# Patient Record
Sex: Male | Born: 1950
Health system: Southern US, Community
[De-identification: ages and names within clinical notes are randomized; demographics above are authoritative.]

---

## 1986-04-08 HISTORY — PX: WISDOM TOOTH EXTRACTION: SHX21

## 2013-08-13 ENCOUNTER — Encounter: Payer: Self-pay | Admitting: Internal Medicine

## 2013-10-04 ENCOUNTER — Ambulatory Visit (AMBULATORY_SURGERY_CENTER): Payer: Medicare Other | Admitting: *Deleted

## 2013-10-04 VITALS — Ht 67.0 in | Wt 216.0 lb

## 2013-10-04 DIAGNOSIS — Z1211 Encounter for screening for malignant neoplasm of colon: Secondary | ICD-10-CM

## 2013-10-04 MED ORDER — MOVIPREP 100 G PO SOLR: ORAL | Status: DC

## 2013-10-04 NOTE — Progress Notes (Signed)
No allergies to eggs or soy. No problems with anesthesia.  Pt given Emmi instructions for colonoscopy  No oxygen use  No diet drug use  

## 2013-10-07 ENCOUNTER — Encounter: Payer: Self-pay | Admitting: Internal Medicine

## 2013-10-18 ENCOUNTER — Encounter: Payer: Self-pay | Admitting: Internal Medicine

## 2013-10-29 ENCOUNTER — Ambulatory Visit (AMBULATORY_SURGERY_CENTER): Payer: Medicare Other | Admitting: Internal Medicine

## 2013-10-29 ENCOUNTER — Encounter: Payer: Self-pay | Admitting: Internal Medicine

## 2013-10-29 VITALS — BP 131/88 | HR 63 | Temp 97.4°F | Resp 13 | Ht 67.0 in | Wt 216.0 lb

## 2013-10-29 DIAGNOSIS — D128 Benign neoplasm of rectum: Secondary | ICD-10-CM

## 2013-10-29 DIAGNOSIS — Z1211 Encounter for screening for malignant neoplasm of colon: Secondary | ICD-10-CM

## 2013-10-29 DIAGNOSIS — D129 Benign neoplasm of anus and anal canal: Secondary | ICD-10-CM

## 2013-10-29 DIAGNOSIS — D126 Benign neoplasm of colon, unspecified: Secondary | ICD-10-CM

## 2013-10-29 MED ORDER — SODIUM CHLORIDE 0.9 % IV SOLN: 500.0000 mL | INTRAVENOUS | Status: DC

## 2013-10-29 NOTE — Progress Notes (Signed)
Procedure ends, to recovery, report given and VSS. 

## 2013-10-29 NOTE — Progress Notes (Signed)
Called to room to assist during endoscopic procedure.  Patient ID and intended procedure confirmed with present staff. Received instructions for my participation in the procedure from the performing physician.  

## 2013-10-29 NOTE — Op Note (Signed)
[facility]  [address]. [place], [address]   COLONOSCOPY PROCEDURE REPORT  PATIENT: Edwin Miller, Edwin Miller  MR#: [id number] BIRTHDATE: [birth year] , 8  yrs. old GENDER: Male ENDOSCOPIST: Eustace Quail, MD REFERRED HE:RDEYC Ardeth Perfect, M.D. PROCEDURE DATE:  10/29/2013 PROCEDURE:   Colonoscopy with snare polypectomy x 1 First Screening Colonoscopy - Avg.  risk and is 50 yrs.  old or older Yes.  Prior Negative Screening - Now for repeat screening. N/A  History of Adenoma - Now for follow-up colonoscopy & has been > or = to 3 yrs.  N/A  Polyps Removed Today? Yes. ASA CLASS:   Class I INDICATIONS:average risk screening. MEDICATIONS: MAC sedation, administered by CRNA and propofol (Diprivan) 350mg  IV  DESCRIPTION OF PROCEDURE:   After the risks benefits and alternatives of the procedure were thoroughly explained, informed consent was obtained.  A digital rectal exam revealed no abnormalities of the rectum.   The LB [id number]  endoscope was introduced through the anus and advanced to the cecum, which was identified by both the appendix and ileocecal valve. No adverse events experienced.   The quality of the prep was excellent, using MoviPrep  The instrument was then slowly withdrawn as the colon was fully examined.   COLON FINDINGS: A diminutive polyp was found in the rectum.  A polypectomy was performed with a cold snare.  The resection was complete and the polyp tissue was completely retrieved. Diverticulosis was noted (a few scattered diverticula). Retroflexed views revealed no abnormalities. The time to cecum=2 minutes 26 seconds.  Withdrawal time=15 minutes 01 seconds.  The scope was withdrawn and the procedure completed.  COMPLICATIONS: There were no complications.  ENDOSCOPIC IMPRESSION: 1.   Diminutive polyp was found in the rectum; polypectomy was performed with a cold snare 2.   Diverticulosis  RECOMMENDATIONS: 1. Repeat colonoscopy in 5 years if  polyp adenomatous; otherwise 10 years   eSigned:  Eustace Quail, MD 10/29/2013 2:32 PM   cc: The Patient and Velna Hatchet MD

## 2013-10-29 NOTE — Patient Instructions (Signed)
YOU HAD AN ENDOSCOPIC PROCEDURE TODAY AT THE [facility]: Refer to the procedure report that was given to you for any specific questions about what was found during the examination.  If the procedure report does not answer your questions, please call your gastroenterologist to clarify.  If you requested that your care partner not be given the details of your procedure findings, then the procedure report has been included in a sealed envelope for you to review at your convenience later.  YOU SHOULD EXPECT: Some feelings of bloating in the abdomen. Passage of more gas than usual.  Walking can help get rid of the air that was put into your GI tract during the procedure and reduce the bloating. If you had a lower endoscopy (such as a colonoscopy or flexible sigmoidoscopy) you may notice spotting of blood in your stool or on the toilet paper. If you underwent a bowel prep for your procedure, then you may not have a normal bowel movement for a few days.  DIET: Your first meal following the procedure should be a light meal and then it is ok to progress to your normal diet.  A half-sandwich or bowl of soup is an example of a good first meal.  Heavy or fried foods are harder to digest and may make you feel nauseous or bloated.  Likewise meals heavy in dairy and vegetables can cause extra gas to form and this can also increase the bloating.  Drink plenty of fluids but you should avoid alcoholic beverages for 24 hours.  ACTIVITY: Your care partner should take you home directly after the procedure.  You should plan to take it easy, moving slowly for the rest of the day.  You can resume normal activity the day after the procedure however you should NOT DRIVE or use heavy machinery for 24 hours (because of the sedation medicines used during the test).    SYMPTOMS TO REPORT IMMEDIATELY: A gastroenterologist can be reached at any hour.  During normal business hours, 8:30 AM to 5:00 PM Monday through Friday,  call [phone].  After hours and on weekends, please call the GI answering service at [phone] who will take a message and have the physician on call contact you.   Following lower endoscopy (colonoscopy or flexible sigmoidoscopy):  Excessive amounts of blood in the stool  Significant tenderness or worsening of abdominal pains  Swelling of the abdomen that is new, acute  Fever of 100F or higher    FOLLOW UP: If any biopsies were taken you will be contacted by phone or by letter within the next 1-3 weeks.  Call your gastroenterologist if you have not heard about the biopsies in 3 weeks.  Our staff will call the home number listed on your records the next business day following your procedure to check on you and address any questions or concerns that you may have at that time regarding the information given to you following your procedure. This is a courtesy call and so if there is no answer at the home number and we have not heard from you through the emergency physician on call, we will assume that you have returned to your regular daily activities without incident.  SIGNATURES/CONFIDENTIALITY: You and/or your care partner have signed paperwork which will be entered into your electronic medical record.  These signatures attest to the fact that that the information above on your After Visit Summary has been reviewed and is understood.  Full responsibility of the confidentiality   of this discharge information lies with you and/or your care-partner.   Polyp, diverticulosis, high fiber diet information given.  Dr. Henrene Pastor will advise you about follow-up appointment when he reviews pathology results.

## 2013-11-01 ENCOUNTER — Telehealth: Payer: Self-pay | Admitting: *Deleted

## 2013-11-01 NOTE — Telephone Encounter (Signed)
Message left

## 2013-11-04 ENCOUNTER — Encounter: Payer: Self-pay | Admitting: Internal Medicine

## 2015-05-19 DIAGNOSIS — L821 Other seborrheic keratosis: Secondary | ICD-10-CM | POA: Diagnosis not present

## 2015-05-19 DIAGNOSIS — L918 Other hypertrophic disorders of the skin: Secondary | ICD-10-CM | POA: Diagnosis not present

## 2015-05-19 DIAGNOSIS — D1801 Hemangioma of skin and subcutaneous tissue: Secondary | ICD-10-CM | POA: Diagnosis not present

## 2015-09-08 DIAGNOSIS — E784 Other hyperlipidemia: Secondary | ICD-10-CM | POA: Diagnosis not present

## 2015-09-08 DIAGNOSIS — Z125 Encounter for screening for malignant neoplasm of prostate: Secondary | ICD-10-CM | POA: Diagnosis not present

## 2015-09-18 DIAGNOSIS — Z1389 Encounter for screening for other disorder: Secondary | ICD-10-CM | POA: Diagnosis not present

## 2015-09-18 DIAGNOSIS — Z23 Encounter for immunization: Secondary | ICD-10-CM | POA: Diagnosis not present

## 2015-09-18 DIAGNOSIS — Z6834 Body mass index (BMI) 34.0-34.9, adult: Secondary | ICD-10-CM | POA: Diagnosis not present

## 2015-09-18 DIAGNOSIS — Z0000 Encounter for general adult medical examination without abnormal findings: Secondary | ICD-10-CM | POA: Diagnosis not present

## 2015-09-19 DIAGNOSIS — Z1212 Encounter for screening for malignant neoplasm of rectum: Secondary | ICD-10-CM | POA: Diagnosis not present

## 2016-01-25 DIAGNOSIS — M25561 Pain in right knee: Secondary | ICD-10-CM | POA: Diagnosis not present

## 2016-01-25 DIAGNOSIS — S76311A Strain of muscle, fascia and tendon of the posterior muscle group at thigh level, right thigh, initial encounter: Secondary | ICD-10-CM | POA: Diagnosis not present

## 2016-02-23 DIAGNOSIS — S76311D Strain of muscle, fascia and tendon of the posterior muscle group at thigh level, right thigh, subsequent encounter: Secondary | ICD-10-CM | POA: Diagnosis not present

## 2016-07-24 ENCOUNTER — Ambulatory Visit: Payer: Self-pay | Admitting: Family Medicine

## 2016-07-24 NOTE — Progress Notes (Signed)
[facility] [place], [place] [address] Phone: [phone] Subjective:    I'm seeing this patient by the request  of:  Miller, SCOTT, MD   CC: Right knee pain   [id number]  Edwin Miller is a 66 y.o. male coming in with complaint of Right knee pain. Been going on 6 months. Patient attempted running and has a severe pain on the back of his knee. Since then he was having intermittent pain for some time. Tented take 6 weeks out from running but when he started again pain came right back. Patient has been working out doing some boot camp but finds that most of the twisting motions are deep squats and causes severe pain in the area. Patient has seen some of his friends and has been told he had a hamstring injury. Also went to an orthopedic office that told him he had a hamstring injury. Patient went to physical therapy but states that that seem to make it worse.     No past medical history on file. Past Surgical History:  Procedure Laterality Date  . [place] EXTRACTION  1988   Social History   Social History  . Marital status: Married    Spouse name: N/A  . Number of children: N/A  . Years of education: N/A   Social History Main Topics  . Smoking status: Never Smoker  . Smokeless tobacco: Never Used  . Alcohol use 1.8 oz/week    3 Glasses of wine per week  . Drug use: No  . Sexual activity: Not on file   Other Topics Concern  . Not on file   Social History Narrative  . No narrative on file   No Known Allergies Family History  Problem Relation Age of Onset  . Colon cancer Neg Hx   . Stomach cancer Neg Hx     Past medical history, social, surgical and family history all reviewed in electronic medical record.  No pertanent information unless stated regarding to the chief complaint.   Review of Systems:Review of systems updated and as accurate as of 07/24/16  No headache, visual changes, nausea, vomiting, diarrhea,  constipation, dizziness, abdominal pain, skin rash, fevers, chills, night sweats, weight loss, swollen lymph nodes, body aches, joint swelling, muscle aches, chest pain, shortness of breath, mood changes.   Objective  There were no vitals taken for this visit. Systems examined below as of 07/24/16   General: No apparent distress alert and oriented x3 mood and affect normal, dressed appropriately.  HEENT: Pupils equal, extraocular movements intact  Respiratory: Patient's speak in full sentences and does not appear short of breath  Cardiovascular: No lower extremity edema, non tender, no erythema  Skin: Warm dry intact with no signs of infection or rash on extremities or on axial skeleton.  Abdomen: Soft nontender  Neuro: Cranial nerves II through XII are intact, neurovascularly intact in all extremities with 2+ DTRs and 2+ pulses.  Lymph: No lymphadenopathy of posterior or anterior cervical chain or axillae bilaterally.  Gait mild antalgic gait MSK:  Non tender with full range of motion and good stability and symmetric strength and tone of shoulders, elbows, wrist, hip, and ankles bilaterally.  Knee: Left  Normal to inspection with no erythema or effusion or obvious bony abnormalities. Mild tenderness over the medial and posterior joint. Mild over the insertion of the medial hamstring ROM full in flexion and extension and lower leg rotation. Ligaments with solid consistent endpoints including ACL,  PCL, LCL, MCL. Positive Mcmurray's, Apley's, and Thessalonian tests. Non painful patellar compression. Patellar glide without crepitus. Patellar and quadriceps tendons unremarkable. Hamstring and quadriceps strength is normal.    MSK US performed of: Left This study was ordered, performed, and interpreted by Charlann Boxer D.O.  Knee: Patient's hamstrings distally seems to be unremarkable. Patient does have a fairly large posterior medial meniscal tear.chronic it appears. Patient does have about  50-75% displacement. Mild arthritic changes of the medial joint IMPRESSION:  Displaced medial meniscal tear    Impression and Recommendations:     This case required medical decision making of moderate complexity.      Note: This dictation was prepared with Dragon dictation along with smaller phrase technology. Any transcriptional errors that result from this process are unintentional.

## 2016-07-25 ENCOUNTER — Ambulatory Visit (INDEPENDENT_AMBULATORY_CARE_PROVIDER_SITE_OTHER): Payer: Medicare HMO | Admitting: Family Medicine

## 2016-07-25 ENCOUNTER — Encounter: Payer: Self-pay | Admitting: Family Medicine

## 2016-07-25 ENCOUNTER — Ambulatory Visit: Payer: Self-pay

## 2016-07-25 VITALS — BP 140/88 | HR 72 | Resp 16 | Wt 214.1 lb

## 2016-07-25 DIAGNOSIS — S83241A Other tear of medial meniscus, current injury, right knee, initial encounter: Secondary | ICD-10-CM | POA: Diagnosis not present

## 2016-07-25 DIAGNOSIS — M25561 Pain in right knee: Secondary | ICD-10-CM

## 2016-07-25 NOTE — Assessment & Plan Note (Signed)
Patient does have a medial meniscal tear. Patient's does have some displacement as well as posteriorly. We discussed with patient at great length. Once to try conservative therapy. Given exercises, discussed compression burst bracing, topical anti-inflammatory try getting, we discussed icing regimen. Discussed which activities to avoid. Follow-up again in 4 weeks. Worsening symptoms consider injection and formal physical therapy.

## 2016-07-25 NOTE — Patient Instructions (Signed)
Good to see you.  You have a meniscal tear  Ice 20 minutes 2 times daily. Usually after activity and before bed. Exercises 3 times a week.  OK to do anything with feet planted or straight ahead. No jumping, twisting, running or lunges.  pennsaid pinkie amount topically 2 times daily as needed.  Heel cup could help the heel.  See me again in 4 weeks.

## 2016-07-25 NOTE — Progress Notes (Signed)
Pre-visit discussion using our clinic review tool. No additional management support is needed unless otherwise documented below in the visit note.  

## 2016-07-30 ENCOUNTER — Telehealth: Payer: Self-pay | Admitting: Internal Medicine

## 2016-07-30 NOTE — Telephone Encounter (Signed)
Patient is feeling better. He wanted to know if rowing would be an acceptable mode of cardio. Recommended to patient that he use the stationary bike or elliptical so that he would not be performing extreme ranges of knee flexion that rowing tends to produce. He also inquired about getting more pennsaid. He said he uses a pack a day. Recommended a finger tip size amount, 2x daily and told him that the 2 boxes provided to him should last approximately one month. Discussed with patient to apply to the medial side of knee as this is where his tear is located. Patient will call when he needs additional samples as he has Medicare.

## 2016-07-30 NOTE — Telephone Encounter (Signed)
Pt called stating he has a couple questions about his assigned exercises to do.  Please call back.

## 2016-08-22 ENCOUNTER — Encounter: Payer: Self-pay | Admitting: Family Medicine

## 2016-08-22 ENCOUNTER — Ambulatory Visit (INDEPENDENT_AMBULATORY_CARE_PROVIDER_SITE_OTHER): Payer: Medicare HMO | Admitting: Family Medicine

## 2016-08-22 DIAGNOSIS — S83241A Other tear of medial meniscus, current injury, right knee, initial encounter: Secondary | ICD-10-CM

## 2016-08-22 NOTE — Progress Notes (Signed)
[facility] [place] [place], [place] [address] Phone: [phone] Subjective:    I'm seeing this patient by the request  of:  Velna Hatchet, MD   CC: Right knee pain f/u  [id number]  Edwin Miller is a 66 y.o. male coming in with complaint of Right knee pain.Patient was originally diagnosed with more of a hamstring tendinitis from an outside facility and was treated with no improvement. Patient did see me and did have more of a degenerative disc tear with displacement. Patient states continued to have some discomfort. States that he is about tendon 20% better. Continues to workout almost on a daily basis. No locking or giving out on him.     No past medical history on file. Past Surgical History:  Procedure Laterality Date  . [place] EXTRACTION  1988   Social History   Social History  . Marital status: Married    Spouse name: N/A  . Number of children: N/A  . Years of education: N/A   Social History Main Topics  . Smoking status: Never Smoker  . Smokeless tobacco: Never Used  . Alcohol use 1.8 oz/week    3 Glasses of wine per week  . Drug use: No  . Sexual activity: Not Asked   Other Topics Concern  . None   Social History Narrative  . None   No Known Allergies Family History  Problem Relation Age of Onset  . Colon cancer Neg Hx   . Stomach cancer Neg Hx     Past medical history, social, surgical and family history all reviewed in electronic medical record.  No pertanent information unless stated regarding to the chief complaint.   Review of Systems: No headache, visual changes, nausea, vomiting, diarrhea, constipation, dizziness, abdominal pain, skin rash, fevers, chills, night sweats, weight loss, swollen lymph nodes, body aches, joint swelling, muscle aches, chest pain, shortness of breath, mood changes.    Objective  Blood pressure 122/82, pulse 72, height 5\' 6"  (1.676 m), weight 208 lb (94.3 kg), SpO2 96  %. Systems examined below as of 08/22/16   Systems examined below as of 08/22/16 General: NAD A&O x3 mood, affect normal  HEENT: Pupils equal, extraocular movements intact no nystagmus Respiratory: not short of breath at rest or with speaking Cardiovascular: No lower extremity edema, non tender Skin: Warm dry intact with no signs of infection or rash on extremities or on axial skeleton. Abdomen: Soft nontender, no masses Neuro: Cranial nerves  intact, neurovascularly intact in all extremities with 2+ DTRs and 2+ pulses. Lymph: No lymphadenopathy appreciated today  Gait normal with good balance and coordination.  MSK:  Non tender with full range of motion and good stability and symmetric strength and tone of shoulders, elbows, wrist, hip, and ankles bilaterally.  Knee: Right Normal to inspection with no erythema or effusion or obvious bony abnormalities. Palpation normal with no warmth, joint line tenderness, patellar tenderness, or condyle tenderness. ROM full in flexion and extension and lower leg rotation. Ligaments with solid consistent endpoints including ACL, PCL, LCL, MCL. Positive Mcmurray's, Apley's, and Thessalonian tests. Non painful patellar compression. Patellar glide without crepitus. Patellar and quadriceps tendons unremarkable. Hamstring and quadriceps strength is normal.  Contralateral knee unremarkable      Impression and Recommendations:     This case required medical decision making of moderate complexity.      Note: This dictation was prepared with Dragon dictation along with smaller phrase technology. Any transcriptional errors that  result from this process are unintentional.

## 2016-08-22 NOTE — Assessment & Plan Note (Signed)
Patient states and some very mild improvement. We did discuss with patient at great length. Discuss different races including formal physical therapy and patient had bad results previously. Patient declined any type of injection. We discussed icing regimen and home exercises. Patient will continue to be active. Encourage him to continue the conservative therapy and follow-up with me in 4-6 weeks. If any locking or giving out and we will discuss advance imaging.  Spent  25 minutes with patient face-to-face and had greater than 50% of counseling including as described above in assessment and plan.

## 2016-08-22 NOTE — Patient Instructions (Signed)
oveall  Looks good I would say over 50% better See me again in 4-6 weeks Keep it up!

## 2016-09-13 DIAGNOSIS — E784 Other hyperlipidemia: Secondary | ICD-10-CM | POA: Diagnosis not present

## 2016-09-13 DIAGNOSIS — Z0000 Encounter for general adult medical examination without abnormal findings: Secondary | ICD-10-CM | POA: Diagnosis not present

## 2016-09-13 DIAGNOSIS — Z125 Encounter for screening for malignant neoplasm of prostate: Secondary | ICD-10-CM | POA: Diagnosis not present

## 2016-09-20 DIAGNOSIS — G4700 Insomnia, unspecified: Secondary | ICD-10-CM | POA: Diagnosis not present

## 2016-09-20 DIAGNOSIS — M25561 Pain in right knee: Secondary | ICD-10-CM | POA: Diagnosis not present

## 2016-09-20 DIAGNOSIS — S83206D Unspecified tear of unspecified meniscus, current injury, right knee, subsequent encounter: Secondary | ICD-10-CM | POA: Diagnosis not present

## 2016-09-20 DIAGNOSIS — Z1389 Encounter for screening for other disorder: Secondary | ICD-10-CM | POA: Diagnosis not present

## 2016-09-20 DIAGNOSIS — E784 Other hyperlipidemia: Secondary | ICD-10-CM | POA: Diagnosis not present

## 2016-09-20 DIAGNOSIS — N183 Chronic kidney disease, stage 3 (moderate): Secondary | ICD-10-CM | POA: Diagnosis not present

## 2016-09-20 DIAGNOSIS — Z0000 Encounter for general adult medical examination without abnormal findings: Secondary | ICD-10-CM | POA: Diagnosis not present

## 2016-09-20 DIAGNOSIS — Z6833 Body mass index (BMI) 33.0-33.9, adult: Secondary | ICD-10-CM | POA: Diagnosis not present

## 2016-09-23 DIAGNOSIS — Z1212 Encounter for screening for malignant neoplasm of rectum: Secondary | ICD-10-CM | POA: Diagnosis not present

## 2016-09-25 ENCOUNTER — Ambulatory Visit: Payer: Medicare HMO | Admitting: Family Medicine

## 2016-10-09 NOTE — Progress Notes (Signed)
[name] [address] [place], [place] [address] Phone: [phone] Subjective:    I'm seeing this patient by the request  of:  Velna Hatchet, MD   CC: Right knee pain f/u  [id number]  Edwin Miller is a 66 y.o. male coming in with complaint of Right knee pain.Patient continues to do home exercises for a acute medial meniscal tear. Patient states He was making some improvement. Declined physical therapy or possible injection. Patient states mild improvement again, no locking or giving out on him.  No swelling, but minimal improvement./      No past medical history on file. Past Surgical History:  Procedure Laterality Date  . [place] EXTRACTION  1988   Social History   Social History  . Marital status: Married    Spouse name: N/A  . Number of children: N/A  . Years of education: N/A   Social History Main Topics  . Smoking status: Never Smoker  . Smokeless tobacco: Never Used  . Alcohol use 1.8 oz/week    3 Glasses of wine per week  . Drug use: No  . Sexual activity: Not Asked   Other Topics Concern  . None   Social History Narrative  . None   No Known Allergies Family History  Problem Relation Age of Onset  . Colon cancer Neg Hx   . Stomach cancer Neg Hx     Past medical history, social, surgical and family history all reviewed in electronic medical record.  No pertanent information unless stated regarding to the chief complaint.   Review of Systems: No headache, visual changes, nausea, vomiting, diarrhea, constipation, dizziness, abdominal pain, skin rash, fevers, chills, night sweats, weight loss, swollen lymph nodes, body aches, joint swelling, chest pain, shortness of breath, mood changes. Muscle aches   Objective  Blood pressure (!) 142/82, pulse 70, weight 209 lb (94.8 kg).   Systems examined below as of 10/10/16 General: NAD A&O x3 mood, affect normal  HEENT: Pupils equal, extraocular movements intact no  nystagmus Respiratory: not short of breath at rest or with speaking Cardiovascular: No lower extremity edema, non tender Skin: Warm dry intact with no signs of infection or rash on extremities or on axial skeleton. Abdomen: Soft nontender, no masses Neuro: Cranial nerves  intact, neurovascularly intact in all extremities with 2+ DTRs and 2+ pulses. Lymph: No lymphadenopathy appreciated today  Gait normal with good balance and coordination.  MSK: Non tender with full range of motion and good stability and symmetric strength and tone of shoulders, elbows, wrist,  hips and ankles bilaterally.   Knee: Right Normal to inspection with no erythema or effusion or obvious bony abnormalities. Mild tenderness over the medial joint line ROM full in flexion and extension and lower leg rotation. Ligaments with solid consistent endpoints including ACL, PCL, LCL, MCL. Mild positive Mcmurray's, Apley's, and Thessalonian tests. Non painful patellar compression. Patellar glide without crepitus. Patellar and quadriceps tendons unremarkable. Hamstring and quadriceps strength is normal.  Contralateral knee unremarkable  MSK US performed of: right knee This study was ordered, performed, and interpreted by Charlann Boxer D.O.  Knee: Posterior medial meniscus still has the significant tear with some mild displacement. Hypoechoic changes is significantly improved.  IMPRESSION: Meniscal tear        Impression and Recommendations:     This case required medical decision making of moderate complexity.      Note: This dictation was prepared with Dragon dictation along with smaller phrase technology.  Any transcriptional errors that result from this process are unintentional.

## 2016-10-10 ENCOUNTER — Ambulatory Visit: Payer: Self-pay

## 2016-10-10 ENCOUNTER — Ambulatory Visit (INDEPENDENT_AMBULATORY_CARE_PROVIDER_SITE_OTHER): Payer: Medicare HMO | Admitting: Family Medicine

## 2016-10-10 ENCOUNTER — Other Ambulatory Visit: Payer: Self-pay | Admitting: *Deleted

## 2016-10-10 ENCOUNTER — Encounter: Payer: Self-pay | Admitting: Family Medicine

## 2016-10-10 VITALS — BP 142/82 | HR 70 | Wt 209.0 lb

## 2016-10-10 DIAGNOSIS — G8929 Other chronic pain: Secondary | ICD-10-CM

## 2016-10-10 DIAGNOSIS — S83241A Other tear of medial meniscus, current injury, right knee, initial encounter: Secondary | ICD-10-CM | POA: Diagnosis not present

## 2016-10-10 DIAGNOSIS — M25561 Pain in right knee: Secondary | ICD-10-CM | POA: Diagnosis not present

## 2016-10-10 NOTE — Assessment & Plan Note (Signed)
Mild improvement. Wilson to physical therapy to try to increase activity. Patient will avoid any twisting.RTC in 4 weeks

## 2016-10-10 NOTE — Patient Instructions (Addendum)
Good to see you  Edwin Miller is your friend.  PT with [facility].  Keep doing everything else See me again in 4-6 weeks and if not better we will do injection

## 2016-10-17 ENCOUNTER — Ambulatory Visit (INDEPENDENT_AMBULATORY_CARE_PROVIDER_SITE_OTHER): Payer: Medicare HMO | Admitting: Physical Therapy

## 2016-10-17 DIAGNOSIS — M6281 Muscle weakness (generalized): Secondary | ICD-10-CM

## 2016-10-17 DIAGNOSIS — M25561 Pain in right knee: Secondary | ICD-10-CM

## 2016-10-17 DIAGNOSIS — G8929 Other chronic pain: Secondary | ICD-10-CM | POA: Diagnosis not present

## 2016-10-17 NOTE — Patient Instructions (Signed)
Access Code: [email or web address]  URL: [email or web address]  Date: 10/17/2016  Prepared by: Colin Rhein   Exercises  Standing Hip Flexor Stretch - 3 reps - 1 sets - 30 sec hold - 1x daily - 7x weekly  Sidelying Quadriceps Stretch with Strap - 3 reps - 1 sets - 30 sec hold - 1x daily - 7x weekly

## 2016-10-17 NOTE — Therapy (Signed)
[facility] [address] [place], [place], [address] Phone: [phone]   Fax:  [phone]  Physical Therapy Evaluation  Patient Details  Name: Edwin Miller MRN: [id number] Date of Birth: [birth year] Referring Provider: Dr. Charlann Boxer  Encounter Date: 10/17/2016      PT End of Session - 10/17/16 1341    Visit Number 1   Number of Visits 6   Date for PT Re-Evaluation 11/28/16   Authorization Type Aetna Medicare   PT Start Time 1300   PT Stop Time 1339   PT Time Calculation (min) 39 min   Activity Tolerance Patient tolerated treatment well   Behavior During Therapy [facility] for tasks assessed/performed      No past medical history on file.  Past Surgical History:  Procedure Laterality Date  . WISDOM TOOTH EXTRACTION  1988    There were no vitals filed for this visit.       Subjective Assessment - 10/17/16 1302    Subjective Pt is a 66 y/o male who presents to OPPT for Rt knee pain, dx from MD as medial meniscus tear.  Pt reports initial injury in Sept 2017, followed up with PA at [facility] and dx with hamstring strain.  Pt went to PT which he reports exacerbated symptoms.  Pt then decided to follow up with Sports Medicine and dx with meniscus tear with ultrasound.  Pt reports since April pain has improved but still having a little difficulty.     Diagnostic tests u/s: medial meniscus tear   Patient Stated Goals wants knee to heal; has been avoiding lunging/twisting   Currently in Pain? Yes   Pain Score 0-No pain  up to 2-3/10   Pain Location Knee   Pain Orientation Right   Pain Descriptors / Indicators Discomfort   Pain Type Chronic pain   Pain Onset More than a month ago   Pain Frequency Intermittent   Aggravating Factors  lunges, twisting, prolonged sitting, hard workouts   Pain Relieving Factors ice            [facility] PT Assessment - 10/17/16 1311      Assessment   Medical Diagnosis Rt knee pain; meniscus tear   Referring Provider Dr. Charlann Boxer   Onset Date/Surgical Date --  Sept 2017   Next MD Visit 11/21/16   Prior Therapy last year at [facility] ortho     Precautions   Precautions Other (comment)   Precaution Comments no twisting     Restrictions   Weight Bearing Restrictions No     Balance Screen   Has the patient fallen in the past 6 months No   Has the patient had a decrease in activity level because of a fear of falling?  No   Is the patient reluctant to leave their home because of a fear of falling?  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   Leisure exercise 3x/wk - classes; travel, reading  Cognition   Overall Cognitive Status Within Functional Limits for tasks assessed     ROM / Strength   AROM / PROM / Strength AROM;Strength     AROM   AROM Assessment Site Knee   Right/Left Knee Right;Left   Right Knee Extension 0   Right Knee Flexion 130   Left Knee Extension 0   Left Knee Flexion 135     Strength   Overall Strength Comments poor VMO activation noted   Strength Assessment Site Hip;Knee   Right/Left Hip Right;Left   Right Hip Flexion 5/5   Right Hip Extension 4/5   Right Hip External Rotation  3+/5   Right Hip Internal Rotation 5/5   Right Hip ABduction 5/5   Right Hip ADduction 4/5   Left Hip Flexion 5/5   Left Hip Extension 4/5   Left Hip External Rotation 4/5   Left Hip Internal Rotation 5/5   Left Hip ABduction 5/5   Left Hip ADduction 5/5   Right/Left Knee  Right;Left   Right Knee Flexion 5/5   Right Knee Extension 5/5   Left Knee Flexion 5/5   Left Knee Extension 5/5     Flexibility   Soft Tissue Assessment /Muscle Length yes  tight hip flexors bil   Quadriceps tightness especially laterally     Palpation   Patella mobility lateral tracking bil     Ambulation/Gait   Gait Pattern Decreased stance time - right;Decreased step length - left            Objective measurements completed on examination: See above findings.                  PT Education - 10/17/16 1341    Education provided Yes   Education Details HEP   Person(s) Educated Patient   Methods Explanation;Demonstration;Handout   Comprehension Verbalized understanding;Returned demonstration;Need further instruction             PT Long Term Goals - 10/17/16 1612      PT LONG TERM GOAL #1   Title independent with HEP (11/28/16)   Time 6   Period Weeks   Status New     PT LONG TERM GOAL #2   Title report pain < 2/10 x 5 days for improved functional mobility (11/28/16)   Time 6   Period Weeks   Status New     PT LONG TERM GOAL #3   Title improve Rt hip ext rotation and extension strength to at least 4/5 for improved function (11/28/16)   Time 6   Period Weeks   Status New                Plan - 10/17/16 1610    Clinical Impression Statement Pt is a 66 y/o male who presents to OPPT for Rt knee pain.  Pt demonstrates mild strength deficits and gait abnormalities affecting functional mobility.  Will benefit from PT to address these deficits.   Clinical Presentation Stable   Clinical Decision Making Low   Rehab Potential Good   PT Frequency 1x / week   PT Duration 6 weeks   PT Treatment/Interventions ADLs/Self Care Home Management;Cryotherapy;Electrical Stimulation;Moist Heat;Ultrasound;Therapeutic exercise;Therapeutic activities;Functional mobility training;Stair training;Gait training;Patient/family education;Manual  techniques;Vasopneumatic Device;Taping;Dry needling   PT Next Visit Plan review HEP given today and from MD office; update PRN; VMO/hip ext/er strengthening   Consulted and Agree with Plan of Care Patient      Patient will benefit from skilled therapeutic intervention in order to  improve the following deficits and impairments:  Abnormal gait, Decreased strength, Pain, Increased fascial restricitons, Increased muscle spasms, Decreased mobility  Visit Diagnosis: Chronic pain of right knee - Plan: PT plan of care cert/re-cert  Muscle weakness (generalized) - Plan: PT plan of care cert/re-cert      [facility] PT PB G-CODES - 10/29/16 1615    Functional Assessment Tool Used  clinical judgement   Functional Limitations Mobility: Walking and moving around   Mobility: Walking and Moving Around Current Status At least 1 percent but less than 20 percent impaired, limited or restricted   Mobility: Walking and Moving Around Goal Status [phone]) At least 1 percent but less than 20 percent impaired, limited or restricted       Problem List Patient Active Problem List   Diagnosis Date Noted  . Acute medial meniscal tear, right, initial encounter 07/25/2016      Laureen Abrahams, PT, DPT 2016-10-29 4:17 PM    [facility] [place], [place], [address] Phone: [phone]   Fax:  [phone]  Name: Edwin Miller MRN: [id number] Date of Birth: 04/17/1950

## 2016-10-24 ENCOUNTER — Ambulatory Visit (INDEPENDENT_AMBULATORY_CARE_PROVIDER_SITE_OTHER): Payer: Medicare HMO | Admitting: Physical Therapy

## 2016-10-24 DIAGNOSIS — M25561 Pain in right knee: Secondary | ICD-10-CM

## 2016-10-24 DIAGNOSIS — M6281 Muscle weakness (generalized): Secondary | ICD-10-CM

## 2016-10-24 DIAGNOSIS — G8929 Other chronic pain: Secondary | ICD-10-CM | POA: Diagnosis not present

## 2016-10-24 NOTE — Therapy (Signed)
[facility] [address] [place], [place], [address] Phone: [phone]   Fax:  [phone]  Physical Therapy Treatment  Patient Details  Name: Edwin Miller MRN: [id number] Date of Birth: 10-22-1950 Referring Provider: Dr. Charlann Boxer  Encounter Date: 10/24/2016      PT End of Session - 10/24/16 1143    Visit Number 2   Number of Visits 6   Date for PT Re-Evaluation 11/28/16   Authorization Type Aetna Medicare   PT Start Time 1100   PT Stop Time 1142   PT Time Calculation (min) 42 min   Activity Tolerance Patient tolerated treatment well   Behavior During Therapy [facility] for tasks assessed/performed      No past medical history on file.  Past Surgical History:  Procedure Laterality Date  . WISDOM TOOTH EXTRACTION  1988    There were no vitals filed for this visit.      Subjective Assessment - 10/24/16 1103    Subjective doesn't know if he is doing the exercises properly but overall doing well   Diagnostic tests u/s: medial meniscus tear   Patient Stated Goals wants knee to heal; has been avoiding lunging/twisting   Currently in Pain? No/denies                         [facility] Adult PT Treatment/Exercise - 10/24/16 1105      Exercises   Exercises Knee/Hip     Knee/Hip Exercises: Stretches   Hip Flexor Stretch Right;3 reps;30 seconds   Hip Flexor Stretch Limitations standing     Knee/Hip Exercises: Aerobic   Recumbent Bike L7 x 6 min     Knee/Hip Exercises: Seated   Long Arc Quad Right;2 sets;15 reps;Weights   Long Arc Quad Weight 3 lbs.   Long [employer] Limitations with ball squeeze     Knee/Hip Exercises: Supine   Bridges 15 reps   Bridges Limitations with strap   Single Leg Bridge Right;15 reps  with strap   Straight Leg Raise with External Rotation Right;2 sets;15 reps   Straight Leg Raise with External Rotation Limitations 3#                PT Education - 10/24/16 1143    Education provided Yes   Education Details reviewed hip flexor stretch and HEP from MD office - only change add slight external rotation with wall sits   Person(s) Educated Patient   Methods Explanation   Comprehension Verbalized understanding             PT Long Term Goals - 10/17/16 1612      PT LONG TERM GOAL #1   Title independent with HEP (11/28/16)   Time 6   Period Weeks   Status New     PT LONG TERM GOAL #2   Title report pain < 2/10 x 5 days for improved functional mobility (11/28/16)   Time 6   Period Weeks   Status New     PT LONG TERM GOAL #3   Title improve Rt hip ext rotation and extension strength to at least 4/5 for improved function (11/28/16)   Time 6   Period Weeks   Status New               Plan - 10/24/16 1144    Clinical Impression Statement Pt tolerated exercises well today, needing min cues for hip flexor stretch added to HEP last session.  Will  continue to benefit from PT to maximize function.   PT Treatment/Interventions ADLs/Self Care Home Management;Cryotherapy;Electrical Stimulation;Moist Heat;Ultrasound;Therapeutic exercise;Therapeutic activities;Functional mobility training;Stair training;Gait training;Patient/family education;Manual techniques;Vasopneumatic Device;Taping;Dry needling   PT Next Visit Plan VMO/hip ext/er strengthening   Consulted and Agree with Plan of Care Patient      Patient will benefit from skilled therapeutic intervention in order to improve the following deficits and impairments:  Abnormal gait, Decreased strength, Pain, Increased fascial restricitons, Increased muscle spasms, Decreased mobility  Visit Diagnosis: Chronic pain of right knee  Muscle weakness (generalized)     Problem List Patient Active Problem List   Diagnosis Date Noted  . Acute medial meniscal tear, right, initial encounter 07/25/2016      Laureen Abrahams, PT, DPT 10/24/16 11:45 AM    [facility] [place], [place], [address] Phone: [phone]   Fax:  [phone]  Name: Edwin Miller MRN: [id number] Date of Birth: 1950-06-18

## 2016-10-31 ENCOUNTER — Ambulatory Visit (INDEPENDENT_AMBULATORY_CARE_PROVIDER_SITE_OTHER): Payer: Medicare HMO | Admitting: Physical Therapy

## 2016-10-31 DIAGNOSIS — M25561 Pain in right knee: Secondary | ICD-10-CM

## 2016-10-31 DIAGNOSIS — G8929 Other chronic pain: Secondary | ICD-10-CM

## 2016-10-31 DIAGNOSIS — M6281 Muscle weakness (generalized): Secondary | ICD-10-CM

## 2016-10-31 NOTE — Therapy (Signed)
[facility] [address] [place], [place], [address] Phone: [phone]   Fax:  [phone]  Physical Therapy Treatment  Patient Details  Name: Edwin Miller MRN: [id number] Date of Birth: 03/21/1951 Referring Provider: Dr. Charlann Boxer  Encounter Date: 10/31/2016      PT End of Session - 10/31/16 1149    Visit Number 3   Number of Visits 6   Date for PT Re-Evaluation 11/28/16   Authorization Type Aetna Medicare   PT Start Time 1100   PT Stop Time 1142   PT Time Calculation (min) 42 min   Activity Tolerance Patient tolerated treatment well   Behavior During Therapy [facility] for tasks assessed/performed      No past medical history on file.  Past Surgical History:  Procedure Laterality Date  . WISDOM TOOTH EXTRACTION  1988    There were no vitals filed for this visit.      Subjective Assessment - 10/31/16 1059    Subjective sore after last session "I can tell I did something."  c/o stiffness after being still   Diagnostic tests u/s: medial meniscus tear   Patient Stated Goals wants knee to heal; has been avoiding lunging/twisting   Currently in Pain? No/denies                         [facility] Adult PT Treatment/Exercise - 10/31/16 1103      Knee/Hip Exercises: Aerobic   Recumbent Bike L8 x 6 min     Knee/Hip Exercises: Standing   Wall Squat 5 reps  30 sec hold     Knee/Hip Exercises: Seated   Other Seated Knee/Hip Exercises hip ir/er x 15 reps on Rt with green theraband     Knee/Hip Exercises: Supine   Single Leg Bridge Both;15 reps  with ball squeeze   Other Supine Knee/Hip Exercises bridging with hamstring curls x 15 reps     Knee/Hip Exercises: Prone   Hip Extension Right;15 reps   Hip Extension Limitations 4#; with bent knee   Straight Leg Raises Right;15 reps   Straight Leg Raises Limitations 4#                PT Education - 10/31/16 1149    Education provided Yes   Education  Details added to HEP   Person(s) Educated Patient   Methods Explanation;Demonstration;Handout   Comprehension Verbalized understanding;Returned demonstration             PT Long Term Goals - 10/17/16 1612      PT LONG TERM GOAL #1   Title independent with HEP (11/28/16)   Time 6   Period Weeks   Status New     PT LONG TERM GOAL #2   Title report pain < 2/10 x 5 days for improved functional mobility (11/28/16)   Time 6   Period Weeks   Status New     PT LONG TERM GOAL #3   Title improve Rt hip ext rotation and extension strength to at least 4/5 for improved function (11/28/16)   Time 6   Period Weeks   Status New               Plan - 10/31/16 1149    Clinical Impression Statement Pt reported soreness following last session, and reports stiffness with prolonged positioning.  Overall tolerating strengthening exercises well with some difficulty with hip extension and external rotation exercises.  Will continue to benefit from PT  to maximize function.   PT Treatment/Interventions ADLs/Self Care Home Management;Cryotherapy;Electrical Stimulation;Moist Heat;Ultrasound;Therapeutic exercise;Therapeutic activities;Functional mobility training;Stair training;Gait training;Patient/family education;Manual techniques;Vasopneumatic Device;Taping;Dry needling   PT Next Visit Plan VMO/hip ext/er strengthening   Consulted and Agree with Plan of Care Patient      Patient will benefit from skilled therapeutic intervention in order to improve the following deficits and impairments:  Abnormal gait, Decreased strength, Pain, Increased fascial restricitons, Increased muscle spasms, Decreased mobility  Visit Diagnosis: Chronic pain of right knee  Muscle weakness (generalized)     Problem List Patient Active Problem List   Diagnosis Date Noted  . Acute medial meniscal tear, right, initial encounter 07/25/2016      Laureen Abrahams, PT, DPT 10/31/16 11:51 AM    [facility] [place], [place], [address] Phone: [phone]   Fax:  [phone]  Name: Raphel Stickles MRN: [id number] Date of Birth: 1950-09-17

## 2016-10-31 NOTE — Patient Instructions (Signed)
Bridging (Single Leg)    Lie on back with feet shoulder width apart and right leg straight. Lift hips toward the ceiling while keeping leg straight. Hold __5__ seconds.  Can squeeze ball between knees if you have a ball. Repeat _15___ times. Do __1-2__ sessions per day.  Gymball: Hamstring Curl (Double Leg)    Lie on back, calves on ball, buttocks on floor. Raise buttocks then roll ball toward buttocks. Repeat __10_ times per set. Lower buttocks to floor between rolls.  Do __1-2_ sets per session.  Extension    Lift leg up in the air and bring it back down. Use __4-5__ lbs on ankle. Repeat __15__ times. Do __1-2__ sessions per day.   Bent Knee Lift (Prone)    Abdomen and head supported, bend left knee and slowly raise hip. Avoid arching low back. Repeat __15__ times per set. Do __1__ sets per session. Do __1-2__ sessions per day.     Hip: External Rotation - Sitting    Left side toward anchor, foot in handle, leg out. Pull foot toward body. May hold leg to keep body still. Repeat __15__ times per set. Do __1__ sets per session. Do __6-7__ sessions per week. Use __5-7__ lb weights.

## 2016-11-07 ENCOUNTER — Ambulatory Visit (INDEPENDENT_AMBULATORY_CARE_PROVIDER_SITE_OTHER): Payer: Medicare HMO | Admitting: Physical Therapy

## 2016-11-07 DIAGNOSIS — M6281 Muscle weakness (generalized): Secondary | ICD-10-CM | POA: Diagnosis not present

## 2016-11-07 DIAGNOSIS — G8929 Other chronic pain: Secondary | ICD-10-CM | POA: Diagnosis not present

## 2016-11-07 DIAGNOSIS — M25561 Pain in right knee: Secondary | ICD-10-CM

## 2016-11-07 NOTE — Therapy (Addendum)
[facility] [address] [place], [place], [address] Phone: [phone]   Fax:  [phone]  Physical Therapy Treatment  Patient Details  Name: Abhijay Morriss MRN: [id number] Date of Birth: 1950/12/17 Referring Provider: Dr. Charlann Boxer  Encounter Date: 11/07/2016      PT End of Session - 11/07/16 1149    Visit Number 4   Number of Visits 6   Date for PT Re-Evaluation 11/28/16   Authorization Type Aetna Medicare   PT Start Time 1101   PT Stop Time 1144   PT Time Calculation (min) 43 min   Activity Tolerance Patient tolerated treatment well   Behavior During Therapy [facility] for tasks assessed/performed      No past medical history on file.  Past Surgical History:  Procedure Laterality Date  . WISDOM TOOTH EXTRACTION  1988    There were no vitals filed for this visit.      Subjective Assessment - 11/07/16 1103    Subjective doing pretty well, not as sore after last session.  hasn't been able to exercise as much (travel, etc).  knee is "fine."   Diagnostic tests u/s: medial meniscus tear   Patient Stated Goals wants knee to heal; has been avoiding lunging/twisting   Currently in Pain? No/denies            [facility] PT Assessment - 11/07/16 1135      Strength   Right Hip Extension 5/5   Right Hip External Rotation  4+/5                     [facility] Adult PT Treatment/Exercise - 11/07/16 1104      Knee/Hip Exercises: Aerobic   Recumbent Bike L14 x 6 min     Knee/Hip Exercises: Supine   Single Leg Bridge Right;15 reps   Other Supine Knee/Hip Exercises Modified Quadruped (knees/elbows): hip extension bil 2x15 reps each with full knee ext and knee flex to 90     Knee/Hip Exercises: Sidelying   Clams 2x15 with green theraband; Right   Other Sidelying Knee/Hip Exercises mini circles with sustained hip abduction x 10 reps each direction                PT Education - 11/07/16 1148    Education provided  Yes   Education Details hip strengthening HEP (clams with resistance, quadruped hip extension, SL bridge)   Person(s) Educated Patient   Methods Explanation;Demonstration;Handout   Comprehension Verbalized understanding;Returned demonstration             PT Long Term Goals - 11/07/16 1149      PT LONG TERM GOAL #1   Title independent with HEP (11/28/16)   Baseline 11/07/16: met to date, new exercises given today   Status On-going     PT LONG TERM GOAL #2   Title report pain < 2/10 x 5 days for improved functional mobility (11/28/16)   Status On-going     PT LONG TERM GOAL #3   Title improve Rt hip ext rotation and extension strength to at least 4/5 for improved function (11/28/16)   Status Achieved               Plan - 11/07/16 1150    Clinical Impression Statement Pt has met strength goal and is independent with current HEP.  Continues to have residual discomfort, not reported as pain, but didn't not rate today.  Pt progressing well at this time, and is requesting to  hold PT as he is going out of town for 10 days.  Pt to call if needed upon return.  If pt doesn't return in 30 days, will plan to d/c.   PT Treatment/Interventions ADLs/Self Care Home Management;Cryotherapy;Electrical Stimulation;Moist Heat;Ultrasound;Therapeutic exercise;Therapeutic activities;Functional mobility training;Stair training;Gait training;Patient/family education;Manual techniques;Vasopneumatic Device;Taping;Dry needling   PT Next Visit Plan hold x 30 days, if pt returns-reassess and tx as indicated, will need new g-code   Consulted and Agree with Plan of Care Patient      Patient will benefit from skilled therapeutic intervention in order to improve the following deficits and impairments:  Abnormal gait, Decreased strength, Pain, Increased fascial restricitons, Increased muscle spasms, Decreased mobility  Visit Diagnosis: Chronic pain of right knee  Muscle weakness (generalized)       [facility]  PT PB G-CODES - 12/05/2016 1152    Functional Assessment Tool Used  clinical judgement   Functional Limitations Mobility: Walking and moving around   Mobility: Walking and Moving Around Goal Status [phone]) At least 1 percent but less than 20 percent impaired, limited or restricted   Mobility: Walking and Moving Around Discharge Status [phone]) At least 1 percent but less than 20 percent impaired, limited or restricted      Problem List Patient Active Problem List   Diagnosis Date Noted  . Acute medial meniscal tear, right, initial encounter 07/25/2016      Laureen Abrahams, PT, DPT 2016-12-05 11:53 AM    [facility] [place], [place], [address] Phone: [phone]   Fax:  [phone]  Name: Brenan Modesto MRN: [id number] Date of Birth: 01-26-1951      PHYSICAL THERAPY DISCHARGE SUMMARY  Visits from Start of Care: 4  Current functional level related to goals / functional outcomes: See above   Remaining deficits: See above; PT held x 30 days and pt didn't return so assume pt is doing well    Education / Equipment: HEP  Plan: Patient agrees to discharge.  Patient goals were partially met. Patient is being discharged due to being pleased with the current functional level.  ?????      Laureen Abrahams, PT, DPT 12/11/16 9:55 AM   [facility] [address] [place], [place], [address] Phone: [phone]  Fax: [phone]

## 2016-11-21 ENCOUNTER — Ambulatory Visit: Payer: Medicare HMO | Admitting: Family Medicine

## 2017-02-11 NOTE — Progress Notes (Signed)
[name] [address], [place] [address] Phone: [phone] Subjective:    I'm seeing this patient by the request  of:    CC: Right knee pain follow-up  [id number]  Edwin Miller is a 66 y.o. male coming in with complaint of right knee pain.  Found to have a medial meniscal tear.  Has been doing conservative therapy for 4 months at this time.  Went to physical therapy.  Patient states that his pain has increased. He has been more aggressive with his working out but has stopped doing the home exercise program. The pain is on the medial aspect of the right knee.      No past medical history on file. Past Surgical History:  Procedure Laterality Date  . WISDOM TOOTH EXTRACTION  1988   Social History   Socioeconomic History  . Marital status: Married    Spouse name: Not on file  . Number of children: Not on file  . Years of education: Not on file  . Highest education level: Not on file  Social Needs  . Financial resource strain: Not on file  . Food insecurity - worry: Not on file  . Food insecurity - inability: Not on file  . Transportation needs - medical: Not on file  . Transportation needs - non-medical: Not on file  Occupational History  . Not on file  Tobacco Use  . Smoking status: Never Smoker  . Smokeless tobacco: Never Used  Substance and Sexual Activity  . Alcohol use: Yes    Alcohol/week: 1.8 oz    Types: 3 Glasses of wine per week  . Drug use: No  . Sexual activity: Not on file  Other Topics Concern  . Not on file  Social History Narrative  . Not on file   No Known Allergies Family History  Problem Relation Age of Onset  . Colon cancer Neg Hx   . Stomach cancer Neg Hx      Past medical history, social, surgical and family history all reviewed in electronic medical record.  No pertanent information unless stated regarding to the chief complaint.   Review of Systems:Review of systems updated and as accurate  as of 02/11/17  No headache, visual changes, nausea, vomiting, diarrhea, constipation, dizziness, abdominal pain, skin rash, fevers, chills, night sweats, weight loss, swollen lymph nodes, body aches, joint swelling, muscle aches, chest pain, shortness of breath, mood changes.   Objective  There were no vitals taken for this visit. Systems examined below as of 02/11/17   General: No apparent distress alert and oriented x3 mood and affect normal, dressed appropriately.  HEENT: Pupils equal, extraocular movements intact  Respiratory: Patient's speak in full sentences and does not appear short of breath  Cardiovascular: No lower extremity edema, non tender, no erythema  Skin: Warm dry intact with no signs of infection or rash on extremities or on axial skeleton.  Abdomen: Soft nontender  Neuro: Cranial nerves II through XII are intact, neurovascularly intact in all extremities with 2+ DTRs and 2+ pulses.  Lymph: No lymphadenopathy of posterior or anterior cervical chain or axillae bilaterally.  Gait normal with good balance and coordination.  MSK:  Non tender with full range of motion and good stability and symmetric strength and tone of shoulders, elbows, wrist, hip, and ankles bilaterally.  Knee:right  Very mild valgus deformity Mild pain over the medial joint line ROM full in flexion and extension and lower leg rotation. Ligaments with solid  consistent endpoints including ACL, PCL, LCL, MCL. Positive Mcmurray's, Apley's, and Thessalonian tests. Non painful patellar compression. Patellar glide without crepitus. Patellar and quadriceps tendons unremarkable. Hamstring and quadriceps strength is normal.  After informed written and verbal consent, patient was seated on exam table. Right knee was prepped with alcohol swab and utilizing anterolateral approach, patient's right knee space was injected with 4:1  marcaine 0.5%: Kenalog 40mg /dL. Patient tolerated the procedure well without immediate  complications.   Impression and Recommendations:     This case required medical decision making of moderate complexity.      Note: This dictation was prepared with Dragon dictation along with smaller phrase technology. Any transcriptional errors that result from this process are unintentional.

## 2017-02-12 ENCOUNTER — Ambulatory Visit: Payer: Self-pay

## 2017-02-12 ENCOUNTER — Encounter: Payer: Self-pay | Admitting: Family Medicine

## 2017-02-12 ENCOUNTER — Ambulatory Visit: Payer: Medicare HMO | Admitting: Family Medicine

## 2017-02-12 VITALS — BP 138/100 | HR 66 | Ht 67.0 in | Wt 207.0 lb

## 2017-02-12 DIAGNOSIS — M25561 Pain in right knee: Secondary | ICD-10-CM | POA: Diagnosis not present

## 2017-02-12 DIAGNOSIS — M1711 Unilateral primary osteoarthritis, right knee: Secondary | ICD-10-CM | POA: Insufficient documentation

## 2017-02-12 DIAGNOSIS — G8929 Other chronic pain: Secondary | ICD-10-CM

## 2017-02-12 NOTE — Assessment & Plan Note (Signed)
Degenerative arthritis in the knee noted. Patient does have a meniscal tear as well.  We discussed the possibility of advanced imaging to further evaluate for possible surgical intervention.  Given an injection today but hopefully will be beneficial.  We discussed icing regimen.  Follow-up again in 4 weeks.

## 2017-02-12 NOTE — Patient Instructions (Signed)
Good to see you  Injected the knee today  pennsaid pinkie amount topically 2 times daily as needed.   We will get you back into PT again  In 2 weeks send me a message and tell me how you are doing If worse or no better then we will get MRI Otherwise lets consider orthovisc See em again in 4 weeks

## 2017-02-26 ENCOUNTER — Encounter: Payer: Self-pay | Admitting: Family Medicine

## 2017-02-26 DIAGNOSIS — M25561 Pain in right knee: Principal | ICD-10-CM

## 2017-02-26 DIAGNOSIS — G8929 Other chronic pain: Secondary | ICD-10-CM

## 2017-03-12 ENCOUNTER — Ambulatory Visit: Payer: Medicare HMO | Admitting: Family Medicine

## 2017-03-20 ENCOUNTER — Ambulatory Visit
Admission: RE | Admit: 2017-03-20 | Discharge: 2017-03-20 | Disposition: A | Discharge status: Home / Self Care | Payer: Medicare HMO | Source: Ambulatory Visit | Attending: Family Medicine | Admitting: Family Medicine

## 2017-03-20 ENCOUNTER — Encounter: Payer: Self-pay | Admitting: Family Medicine

## 2017-03-20 DIAGNOSIS — M25561 Pain in right knee: Principal | ICD-10-CM

## 2017-03-20 DIAGNOSIS — G8929 Other chronic pain: Secondary | ICD-10-CM

## 2017-03-21 ENCOUNTER — Encounter: Payer: Self-pay | Admitting: Family Medicine

## 2017-04-09 ENCOUNTER — Encounter: Payer: Self-pay | Admitting: Family Medicine

## 2017-04-15 ENCOUNTER — Telehealth: Payer: Self-pay | Admitting: *Deleted

## 2017-04-15 NOTE — Telephone Encounter (Signed)
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

## 2017-04-23 ENCOUNTER — Ambulatory Visit: Payer: Medicare HMO | Admitting: Family Medicine

## 2017-04-23 ENCOUNTER — Encounter: Payer: Self-pay | Admitting: Family Medicine

## 2017-04-23 DIAGNOSIS — M1711 Unilateral primary osteoarthritis, right knee: Secondary | ICD-10-CM

## 2017-04-23 NOTE — Progress Notes (Signed)
[name] [address], [place] [address] Phone: [phone] Subjective:    CC: Knee pain follow-up  [id number]  Edwin Miller is a 67 y.o. male coming in with complaint of knee pain.  Patient did have some underlying osteoarthritic changes but in addition it is had more of a meniscal tear noted previously.  Patient failed all conservative therapy and continued to have worsening symptoms.  Patient was found to have advanced osteoarthritic changes mostly in the medial compartment with an extensive tear of the posterior horn of the medial meniscus.  Independently visualized by me.  Incidental Baker's cyst also noted.  Patient states because of the arthritis he would like to consider Visco supplementation.  Patient is here to be evaluated and discussed.     No past medical history on file. Past Surgical History:  Procedure Laterality Date  . WISDOM TOOTH EXTRACTION  1988   Social History   Socioeconomic History  . Marital status: Married    Spouse name: None  . Number of children: None  . Years of education: None  . Highest education level: None  Social Needs  . Financial resource strain: None  . Food insecurity - worry: None  . Food insecurity - inability: None  . Transportation needs - medical: None  . Transportation needs - non-medical: None  Occupational History  . None  Tobacco Use  . Smoking status: Never Smoker  . Smokeless tobacco: Never Used  Substance and Sexual Activity  . Alcohol use: Yes    Alcohol/week: 1.8 oz    Types: 3 Glasses of wine per week  . Drug use: No  . Sexual activity: None  Other Topics Concern  . None  Social History Narrative  . None   No Known Allergies Family History  Problem Relation Age of Onset  . Colon cancer Neg Hx   . Stomach cancer Neg Hx      Past medical history, social, surgical and family history all reviewed in electronic medical record.  No pertanent information unless stated  regarding to the chief complaint.   Review of Systems:Review of systems updated and as accurate as of 04/23/17  No headache, visual changes, nausea, vomiting, diarrhea, constipation, dizziness, abdominal pain, skin rash, fevers, chills, night sweats, weight loss, swollen lymph nodes, body aches, joint swelling, muscle aches, chest pain, shortness of breath, mood changes.   Objective  Blood pressure 140/90, pulse 76, weight 207 lb (93.9 kg), SpO2 98 %. Systems examined below as of 04/23/17   General: No apparent distress alert and oriented x3 mood and affect normal, dressed appropriately.  HEENT: Pupils equal, extraocular movements intact  Respiratory: Patient's speak in full sentences and does not appear short of breath  Cardiovascular: No lower extremity edema, non tender, no erythema  Skin: Warm dry intact with no signs of infection or rash on extremities or on axial skeleton.  Abdomen: Soft nontender  Neuro: Cranial nerves II through XII are intact, neurovascularly intact in all extremities with 2+ DTRs and 2+ pulses.  Lymph: No lymphadenopathy of posterior or anterior cervical chain or axillae bilaterally.  Gait normal with good balance and coordination.  MSK:  Non tender with full range of motion and good stability and symmetric strength and tone of shoulders, elbows, wrist, hip and ankles bilaterally.  Knee: Right valgus deformity noted.  Normal thigh to calf ratio.  Tender to palpation over medial joint line ROM full in flexion and extension and lower leg rotation. instability  with valgus force.  Mild painful patellar compression. Patellar glide with mild crepitus. Patellar and quadriceps tendons unremarkable. Hamstring and quadriceps strength is normal. Contralateral knee shows minimal arthritic changes of minimal pain  After informed written and verbal consent, patient was seated on exam table. Right knee was prepped with alcohol swab and utilizing anterolateral approach,  patient's right knee space was injected with15 mg/2.5 mL of Orthovisc(sodium hyaluronate) in a prefilled syringe was injected easily into the knee through a 22-gauge needle..Patient tolerated the procedure well without immediate complications.    Impression and Recommendations:     This case required medical decision making of moderate complexity.      Note: This dictation was prepared with Dragon dictation along with smaller phrase technology. Any transcriptional errors that result from this process are unintentional.

## 2017-04-23 NOTE — Assessment & Plan Note (Signed)
Failed conservative therapy.  Starting Visco supplementation today.  Patient does have some abnormal thigh to calf ratio because of this I do think will do well with a custom medial unloader brace.  We discussed icing regimen, home exercise, which activities to doing which wants to avoid.  Patient will follow-up in 1 week for second in a series of 4 injections.

## 2017-04-23 NOTE — Patient Instructions (Signed)
Good to see you  Edwin Miller is your friend.  Stay active.   We will see you the next 3 weeks.  See you soon.

## 2017-04-30 ENCOUNTER — Encounter: Payer: Self-pay | Admitting: Family Medicine

## 2017-04-30 ENCOUNTER — Ambulatory Visit: Payer: Medicare HMO | Admitting: Family Medicine

## 2017-04-30 DIAGNOSIS — M1711 Unilateral primary osteoarthritis, right knee: Secondary | ICD-10-CM | POA: Diagnosis not present

## 2017-04-30 NOTE — Progress Notes (Signed)
[facility] [address] [place], [place] [address] Phone: [phone] Subjective:     CC: Right knee follow-up  [id number]  Edwin Miller is a 67 y.o. male coming in with complaint of right knee pain.  Found to have degenerative joint disease.  Failed all conservative therapy.  Started Visco supplementation.  Here for second in a series of 4 injections.  Patient states minimal improvement at this time.    No past medical history on file. Past Surgical History:  Procedure Laterality Date  . WISDOM TOOTH EXTRACTION  1988   Social History   Socioeconomic History  . Marital status: Married    Spouse name: Not on file  . Number of children: Not on file  . Years of education: Not on file  . Highest education level: Not on file  Social Needs  . Financial resource strain: Not on file  . Food insecurity - worry: Not on file  . Food insecurity - inability: Not on file  . Transportation needs - medical: Not on file  . Transportation needs - non-medical: Not on file  Occupational History  . Not on file  Tobacco Use  . Smoking status: Never Smoker  . Smokeless tobacco: Never Used  Substance and Sexual Activity  . Alcohol use: Yes    Alcohol/week: 1.8 oz    Types: 3 Glasses of wine per week  . Drug use: No  . Sexual activity: Not on file  Other Topics Concern  . Not on file  Social History Narrative  . Not on file   No Known Allergies Family History  Problem Relation Age of Onset  . Colon cancer Neg Hx   . Stomach cancer Neg Hx      Past medical history, social, surgical and family history all reviewed in electronic medical record.  No pertanent information unless stated regarding to the chief complaint.   Review of Systems:Review of systems updated and as accurate as of 04/30/17  No headache, visual changes, nausea, vomiting, diarrhea, constipation, dizziness, abdominal pain, skin rash, fevers, chills, night sweats, weight loss, swollen  lymph nodes, body aches, joint swelling, muscle aches, chest pain, shortness of breath, mood changes.   Objective  There were no vitals taken for this visit. Systems examined below as of 04/30/17   General: No apparent distress alert and oriented x3 mood and affect normal, dressed appropriately.  HEENT: Pupils equal, extraocular movements intact  Respiratory: Patient's speak in full sentences and does not appear short of breath  Cardiovascular: No lower extremity edema, non tender, no erythema  Skin: Warm dry intact with no signs of infection or rash on extremities or on axial skeleton.  Abdomen: Soft nontender  Neuro: Cranial nerves II through XII are intact, neurovascularly intact in all extremities with 2+ DTRs and 2+ pulses.  Lymph: No lymphadenopathy of posterior or anterior cervical chain or axillae bilaterally.  Gait normal with good balance and coordination.  MSK:  Non tender with full range of motion and good stability and symmetric strength and tone of shoulders, elbows, wrist, hip, and ankles bilaterally.  Knee: The right valgus deformity noted.  Abnormal thigh to calf ratio.  Tender to palpation over medial and PF joint line.  ROM full in flexion and extension and lower leg rotation. instability with valgus force.  painful patellar compression. Patellar glide with moderate crepitus. Patellar and quadriceps tendons unremarkable. Hamstring and quadriceps strength is normal. Contralateral knee shows minimal arthritic changes  After informed written  and verbal consent, patient was seated on exam table. Right knee was prepped with alcohol swab and utilizing anterolateral approach, patient's right knee space was injected with15 mg/2.5 mL of Orthovisc(sodium hyaluronate) in a prefilled syringe was injected easily into the knee through a 22-gauge needle..Patient tolerated the procedure well without immediate complications.    Impression and Recommendations:     This case required  medical decision making of moderate complexity.      Note: This dictation was prepared with Dragon dictation along with smaller phrase technology. Any transcriptional errors that result from this process are unintentional.

## 2017-04-30 NOTE — Patient Instructions (Signed)
Good to see you  Edwin Miller is your friend.  2 down 2 to go  See you again next week

## 2017-04-30 NOTE — Assessment & Plan Note (Signed)
Second in a series of 4 injections given.  Patient is in the process of getting conservative therapy and follow-up in 1 week for third injection out of 4.

## 2017-05-06 NOTE — Progress Notes (Signed)
[name] [address] [place], [place] [address] Phone: [phone] Subjective:     CC: Knee pain follow-up  [id number]  Edwin Miller is a 67 y.o. male coming in with complaint of degenerative arthritis of the knee.  Patient has failed all conservative therapy and is here for Visco supplementation injection again.  Patient states doing much better overall.  Still some mild discomfort but is able to work out on a regular basis which is the first time in quite some time.    No past medical history on file. Past Surgical History:  Procedure Laterality Date  . WISDOM TOOTH EXTRACTION  1988   Social History   Socioeconomic History  . Marital status: Married    Spouse name: Not on file  . Number of children: Not on file  . Years of education: Not on file  . Highest education level: Not on file  Social Needs  . Financial resource strain: Not on file  . Food insecurity - worry: Not on file  . Food insecurity - inability: Not on file  . Transportation needs - medical: Not on file  . Transportation needs - non-medical: Not on file  Occupational History  . Not on file  Tobacco Use  . Smoking status: Never Smoker  . Smokeless tobacco: Never Used  Substance and Sexual Activity  . Alcohol use: Yes    Alcohol/week: 1.8 oz    Types: 3 Glasses of wine per week  . Drug use: No  . Sexual activity: Not on file  Other Topics Concern  . Not on file  Social History Narrative  . Not on file   No Known Allergies Family History  Problem Relation Age of Onset  . Colon cancer Neg Hx   . Stomach cancer Neg Hx      Past medical history, social, surgical and family history all reviewed in electronic medical record.  No pertanent information unless stated regarding to the chief complaint.   Review of Systems:Review of systems updated and as accurate as of 05/06/17  No headache, visual changes, nausea, vomiting, diarrhea, constipation, dizziness,  abdominal pain, skin rash, fevers, chills, night sweats, weight loss, swollen lymph nodes, body aches, joint swelling, muscle aches, chest pain, shortness of breath, mood changes.   Objective  There were no vitals taken for this visit. Systems examined below as of 05/06/17   General: No apparent distress alert and oriented x3 mood and affect normal, dressed appropriately.  HEENT: Pupils equal, extraocular movements intact  Respiratory: Patient's speak in full sentences and does not appear short of breath  Cardiovascular: No lower extremity edema, non tender, no erythema  Skin: Warm dry intact with no signs of infection or rash on extremities or on axial skeleton.  Abdomen: Soft nontender  Neuro: Cranial nerves II through XII are intact, neurovascularly intact in all extremities with 2+ DTRs and 2+ pulses.  Lymph: No lymphadenopathy of posterior or anterior cervical chain or axillae bilaterally.  Gait normal with good balance and coordination.  MSK:  Non tender with full range of motion and good stability and symmetric strength and tone of shoulders, elbows, wrist, hip, and ankles bilaterally.  Knee: Right valgus deformity noted. Large thigh to calf ratio.  Tender to palpation over medial and PF joint line.  ROM full in flexion and extension and lower leg rotation. instability with valgus force.  painful patellar compression. Patellar glide with moderate crepitus. Patellar and quadriceps tendons unremarkable. Hamstring and quadriceps strength  is normal. Contralateral knee shows contralateral   After informed written and verbal consent, patient was seated on exam table. Right knee was prepped with alcohol swab and utilizing anterolateral approach, patient's right knee space was injected with15 mg/2.5 mL of Orthovisc(sodium hyaluronate) in a prefilled syringe was injected easily into the knee through a 22-gauge needle..Patient tolerated the procedure well without immediate  complications.     Impression and Recommendations:     This case required medical decision making of moderate complexity.      Note: This dictation was prepared with Dragon dictation along with smaller phrase technology. Any transcriptional errors that result from this process are unintentional.

## 2017-05-07 ENCOUNTER — Ambulatory Visit: Payer: Medicare HMO | Admitting: Family Medicine

## 2017-05-07 ENCOUNTER — Encounter: Payer: Self-pay | Admitting: Family Medicine

## 2017-05-07 DIAGNOSIS — M1711 Unilateral primary osteoarthritis, right knee: Secondary | ICD-10-CM | POA: Diagnosis not present

## 2017-05-07 NOTE — Patient Instructions (Signed)
Good to see you  Edwin Miller is your friend.  3 down and one to go! Keep working it out at [employer]!

## 2017-05-07 NOTE — Assessment & Plan Note (Signed)
Patient does have degenerative changes.  We discussed icing regimen and home exercises.  Patient will come back in 1 week for fourth and final injection

## 2017-05-14 ENCOUNTER — Encounter: Payer: Self-pay | Admitting: Family Medicine

## 2017-05-14 ENCOUNTER — Ambulatory Visit: Payer: Medicare HMO | Admitting: Family Medicine

## 2017-05-14 DIAGNOSIS — M1711 Unilateral primary osteoarthritis, right knee: Secondary | ICD-10-CM

## 2017-05-14 NOTE — Assessment & Plan Note (Signed)
Doing much better at this time.  I believe patient will do well with this conservative therapy.  Follow-up with me again in 4 weeks.

## 2017-05-14 NOTE — Progress Notes (Signed)
Procedure note  Patient is here for Visco supplementation.  4 out of 4 injections given today.  After informed written and verbal consent, patient was seated on exam table. Right knee was prepped with alcohol swab and utilizing anterolateral approach, patient's right knee space was injected with15 mg/2.5 mL of Orthovisc(sodium hyaluronate) in a prefilled syringe was injected easily into the knee through a 22-gauge needle..Patient tolerated the procedure well without immediate complications.

## 2017-05-14 NOTE — Patient Instructions (Signed)
You did it Ice is your friend.  You get a break from me (except at the gym) See me again in 4 weeks

## 2017-09-15 DIAGNOSIS — E7849 Other hyperlipidemia: Secondary | ICD-10-CM | POA: Diagnosis not present

## 2017-09-15 DIAGNOSIS — Z125 Encounter for screening for malignant neoplasm of prostate: Secondary | ICD-10-CM | POA: Diagnosis not present

## 2017-09-15 DIAGNOSIS — R82998 Other abnormal findings in urine: Secondary | ICD-10-CM | POA: Diagnosis not present

## 2017-09-22 DIAGNOSIS — Z1389 Encounter for screening for other disorder: Secondary | ICD-10-CM | POA: Diagnosis not present

## 2017-09-22 DIAGNOSIS — Z6832 Body mass index (BMI) 32.0-32.9, adult: Secondary | ICD-10-CM | POA: Diagnosis not present

## 2017-09-22 DIAGNOSIS — E7849 Other hyperlipidemia: Secondary | ICD-10-CM | POA: Diagnosis not present

## 2017-09-22 DIAGNOSIS — Z0000 Encounter for general adult medical examination without abnormal findings: Secondary | ICD-10-CM | POA: Diagnosis not present

## 2017-09-22 DIAGNOSIS — N183 Chronic kidney disease, stage 3 (moderate): Secondary | ICD-10-CM | POA: Diagnosis not present

## 2017-09-22 DIAGNOSIS — G4709 Other insomnia: Secondary | ICD-10-CM | POA: Diagnosis not present

## 2017-09-22 DIAGNOSIS — Z1212 Encounter for screening for malignant neoplasm of rectum: Secondary | ICD-10-CM | POA: Diagnosis not present

## 2017-09-22 DIAGNOSIS — M25561 Pain in right knee: Secondary | ICD-10-CM | POA: Diagnosis not present

## 2018-09-18 DIAGNOSIS — N183 Chronic kidney disease, stage 3 (moderate): Secondary | ICD-10-CM | POA: Diagnosis not present

## 2018-09-18 DIAGNOSIS — Z125 Encounter for screening for malignant neoplasm of prostate: Secondary | ICD-10-CM | POA: Diagnosis not present

## 2018-09-18 DIAGNOSIS — E7849 Other hyperlipidemia: Secondary | ICD-10-CM | POA: Diagnosis not present

## 2018-09-21 DIAGNOSIS — H2513 Age-related nuclear cataract, bilateral: Secondary | ICD-10-CM | POA: Diagnosis not present

## 2018-09-21 DIAGNOSIS — H5213 Myopia, bilateral: Secondary | ICD-10-CM | POA: Diagnosis not present

## 2018-09-22 DIAGNOSIS — R82998 Other abnormal findings in urine: Secondary | ICD-10-CM | POA: Diagnosis not present

## 2018-09-29 DIAGNOSIS — E785 Hyperlipidemia, unspecified: Secondary | ICD-10-CM | POA: Diagnosis not present

## 2018-09-29 DIAGNOSIS — S83206D Unspecified tear of unspecified meniscus, current injury, right knee, subsequent encounter: Secondary | ICD-10-CM | POA: Diagnosis not present

## 2018-09-29 DIAGNOSIS — Z0000 Encounter for general adult medical examination without abnormal findings: Secondary | ICD-10-CM | POA: Diagnosis not present

## 2018-09-29 DIAGNOSIS — Z1331 Encounter for screening for depression: Secondary | ICD-10-CM | POA: Diagnosis not present

## 2018-09-29 DIAGNOSIS — G4700 Insomnia, unspecified: Secondary | ICD-10-CM | POA: Diagnosis not present

## 2018-09-29 DIAGNOSIS — N183 Chronic kidney disease, stage 3 (moderate): Secondary | ICD-10-CM | POA: Diagnosis not present

## 2019-03-10 DIAGNOSIS — H16202 Unspecified keratoconjunctivitis, left eye: Secondary | ICD-10-CM | POA: Diagnosis not present

## 2019-10-08 DIAGNOSIS — E7849 Other hyperlipidemia: Secondary | ICD-10-CM | POA: Diagnosis not present

## 2019-10-08 DIAGNOSIS — Z0000 Encounter for general adult medical examination without abnormal findings: Secondary | ICD-10-CM | POA: Diagnosis not present

## 2019-10-08 DIAGNOSIS — Z125 Encounter for screening for malignant neoplasm of prostate: Secondary | ICD-10-CM | POA: Diagnosis not present

## 2019-10-14 DIAGNOSIS — Z20828 Contact with and (suspected) exposure to other viral communicable diseases: Secondary | ICD-10-CM | POA: Diagnosis not present

## 2019-10-15 DIAGNOSIS — R972 Elevated prostate specific antigen [PSA]: Secondary | ICD-10-CM | POA: Diagnosis not present

## 2019-10-15 DIAGNOSIS — Z1331 Encounter for screening for depression: Secondary | ICD-10-CM | POA: Diagnosis not present

## 2019-10-15 DIAGNOSIS — G4700 Insomnia, unspecified: Secondary | ICD-10-CM | POA: Diagnosis not present

## 2019-10-15 DIAGNOSIS — R82998 Other abnormal findings in urine: Secondary | ICD-10-CM | POA: Diagnosis not present

## 2019-10-15 DIAGNOSIS — M542 Cervicalgia: Secondary | ICD-10-CM | POA: Diagnosis not present

## 2019-10-15 DIAGNOSIS — N1831 Chronic kidney disease, stage 3a: Secondary | ICD-10-CM | POA: Diagnosis not present

## 2019-10-15 DIAGNOSIS — Z1339 Encounter for screening examination for other mental health and behavioral disorders: Secondary | ICD-10-CM | POA: Diagnosis not present

## 2019-10-15 DIAGNOSIS — E785 Hyperlipidemia, unspecified: Secondary | ICD-10-CM | POA: Diagnosis not present

## 2019-10-15 DIAGNOSIS — Z0000 Encounter for general adult medical examination without abnormal findings: Secondary | ICD-10-CM | POA: Diagnosis not present

## 2019-10-18 DIAGNOSIS — K921 Melena: Secondary | ICD-10-CM | POA: Diagnosis not present

## 2020-02-16 DIAGNOSIS — R972 Elevated prostate specific antigen [PSA]: Secondary | ICD-10-CM | POA: Diagnosis not present

## 2020-02-16 DIAGNOSIS — Z20828 Contact with and (suspected) exposure to other viral communicable diseases: Secondary | ICD-10-CM | POA: Diagnosis not present

## 2020-10-25 DIAGNOSIS — Z125 Encounter for screening for malignant neoplasm of prostate: Secondary | ICD-10-CM | POA: Diagnosis not present

## 2020-10-25 DIAGNOSIS — Z20818 Contact with and (suspected) exposure to other bacterial communicable diseases: Secondary | ICD-10-CM | POA: Diagnosis not present

## 2020-10-25 DIAGNOSIS — E785 Hyperlipidemia, unspecified: Secondary | ICD-10-CM | POA: Diagnosis not present

## 2020-11-14 DIAGNOSIS — E785 Hyperlipidemia, unspecified: Secondary | ICD-10-CM | POA: Diagnosis not present

## 2020-11-14 DIAGNOSIS — Z0000 Encounter for general adult medical examination without abnormal findings: Secondary | ICD-10-CM | POA: Diagnosis not present

## 2020-11-14 DIAGNOSIS — Z1331 Encounter for screening for depression: Secondary | ICD-10-CM | POA: Diagnosis not present

## 2020-11-14 DIAGNOSIS — Z1339 Encounter for screening examination for other mental health and behavioral disorders: Secondary | ICD-10-CM | POA: Diagnosis not present
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
## 2021-04-26 ENCOUNTER — Ambulatory Visit (INDEPENDENT_AMBULATORY_CARE_PROVIDER_SITE_OTHER): Payer: Medicare HMO

## 2021-04-26 ENCOUNTER — Ambulatory Visit: Payer: Self-pay

## 2021-04-26 ENCOUNTER — Other Ambulatory Visit: Payer: Self-pay

## 2021-04-26 ENCOUNTER — Ambulatory Visit: Payer: Medicare HMO | Admitting: Family Medicine

## 2021-04-26 VITALS — BP 124/84 | HR 56 | Ht 66.0 in | Wt 210.0 lb

## 2021-04-26 DIAGNOSIS — M25562 Pain in left knee: Secondary | ICD-10-CM

## 2021-04-26 DIAGNOSIS — M25511 Pain in right shoulder: Secondary | ICD-10-CM

## 2021-04-26 DIAGNOSIS — M23204 Derangement of unspecified medial meniscus due to old tear or injury, left knee: Secondary | ICD-10-CM

## 2021-04-26 DIAGNOSIS — G8929 Other chronic pain: Secondary | ICD-10-CM

## 2021-04-26 MED ORDER — MELOXICAM 7.5 MG PO TABS: 7.5000 mg | ORAL_TABLET | Freq: Every day | ORAL | 0 refills | Status: AC

## 2021-04-26 NOTE — Assessment & Plan Note (Signed)
Tearing noted but seems to be degenerative.  Discussed that I do feel that conservative therapy would be the most helpful.  Given meloxicam.  Discussed icing regimen and home exercises.  If worsening pain consider possible injection and aspiration.  Patient could also be a candidate for viscosupplementation.  X-rays pending.  Follow-up again 6 weeks

## 2021-04-26 NOTE — Assessment & Plan Note (Signed)
Concern for some degenerative arthritis.  Concerned that patient may have had a small or partial rotator cuff tear 2 years ago.  I believe that this could be contributing to some of the discomfort and likely has some acromioclavicular arthritis we might need to consider.  Follow-up again in 4 to 6 weeks

## 2021-04-26 NOTE — Patient Instructions (Addendum)
Xray today Ice 20 min 2x a day Exercises Meloxicam 7.5 for 10 days then as needed Voltaren gel  Ice 20 min 2x a day If not better we will step it up See me in 4-6 weeks

## 2021-05-31 ENCOUNTER — Ambulatory Visit: Payer: Medicare HMO | Admitting: Family Medicine

## 2021-11-13 DIAGNOSIS — R7989 Other specified abnormal findings of blood chemistry: Secondary | ICD-10-CM | POA: Diagnosis not present

## 2021-11-13 DIAGNOSIS — Z125 Encounter for screening for malignant neoplasm of prostate: Secondary | ICD-10-CM | POA: Diagnosis not present

## 2021-11-13 DIAGNOSIS — E785 Hyperlipidemia, unspecified: Secondary | ICD-10-CM | POA: Diagnosis not present

## 2021-11-22 DIAGNOSIS — E785 Hyperlipidemia, unspecified: Secondary | ICD-10-CM | POA: Diagnosis not present

## 2021-11-22 DIAGNOSIS — Z1339 Encounter for screening examination for other mental health and behavioral disorders: Secondary | ICD-10-CM | POA: Diagnosis not present

## 2021-11-22 DIAGNOSIS — D751 Secondary polycythemia: Secondary | ICD-10-CM | POA: Diagnosis not present

## 2021-11-22 DIAGNOSIS — Z0000 Encounter for general adult medical examination without abnormal findings: Secondary | ICD-10-CM | POA: Diagnosis not present

## 2021-11-22 DIAGNOSIS — Z1331 Encounter for screening for depression: Secondary | ICD-10-CM | POA: Diagnosis not present

## 2022-04-18 DIAGNOSIS — H2513 Age-related nuclear cataract, bilateral: Secondary | ICD-10-CM | POA: Diagnosis not present

## 2022-04-18 DIAGNOSIS — H524 Presbyopia: Secondary | ICD-10-CM | POA: Diagnosis not present

## 2022-05-21 IMAGING — DX DG SHOULDER 2+V*R*
3 series · 3 of 3 positions shown · non-contrast
Comparison: None.

CLINICAL DATA: Right shoulder pain, weakness for 2 years

EXAM:
RIGHT SHOULDER - 2+ VIEW

[shoulder ap (1 of 2)]
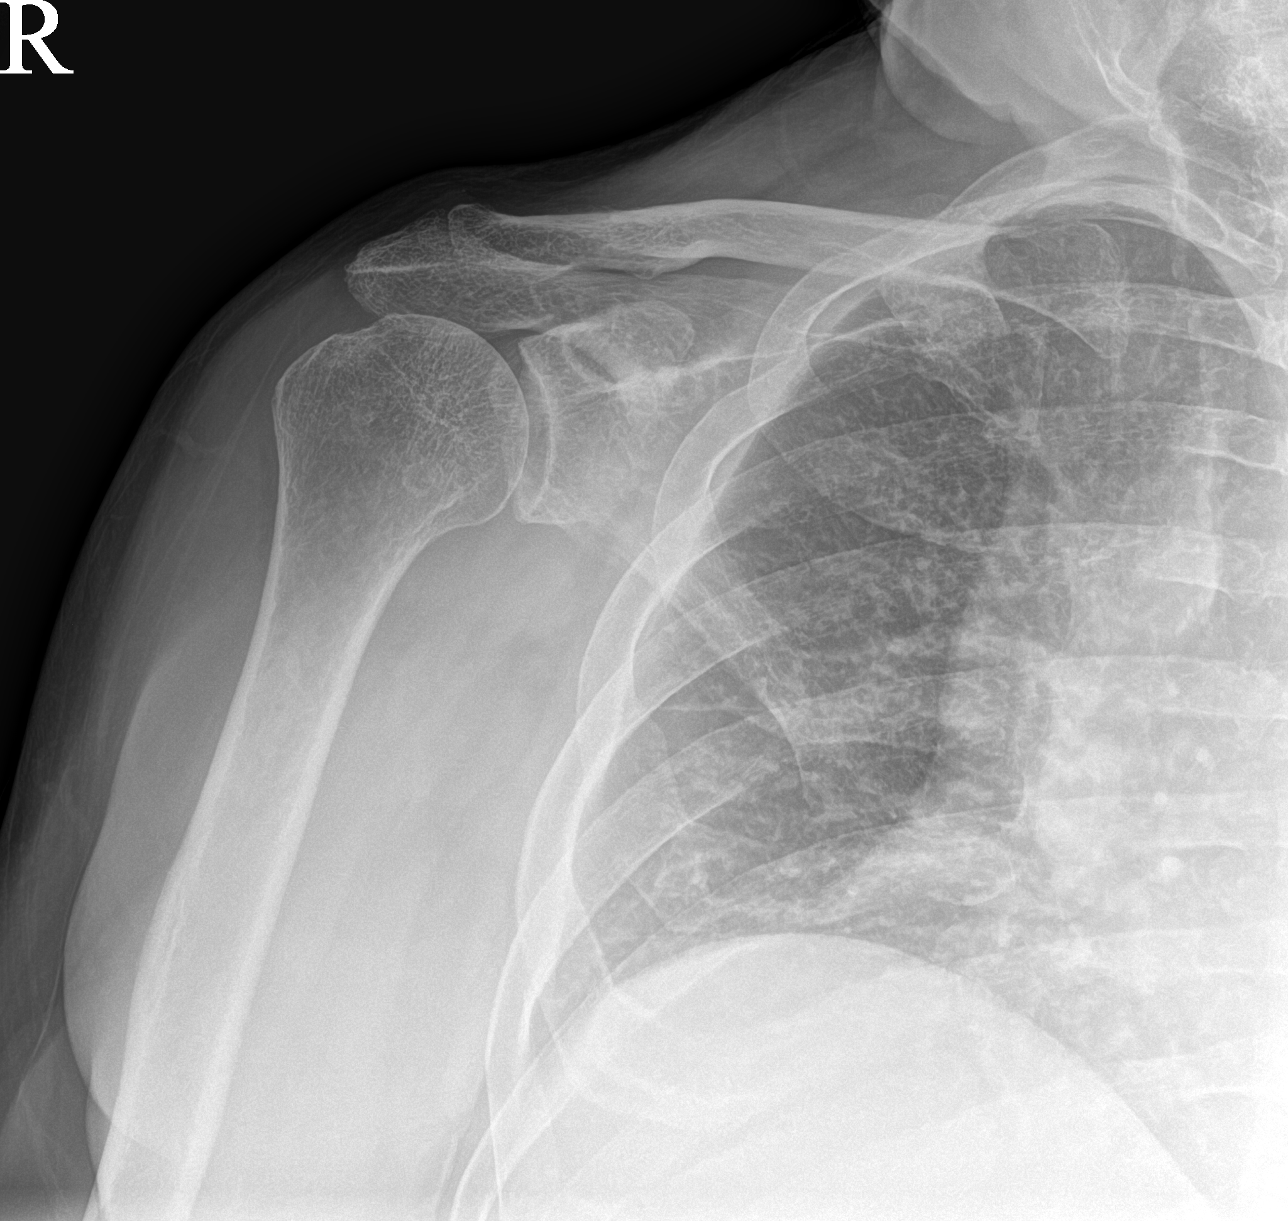

[shoulder ap (2 of 2)]
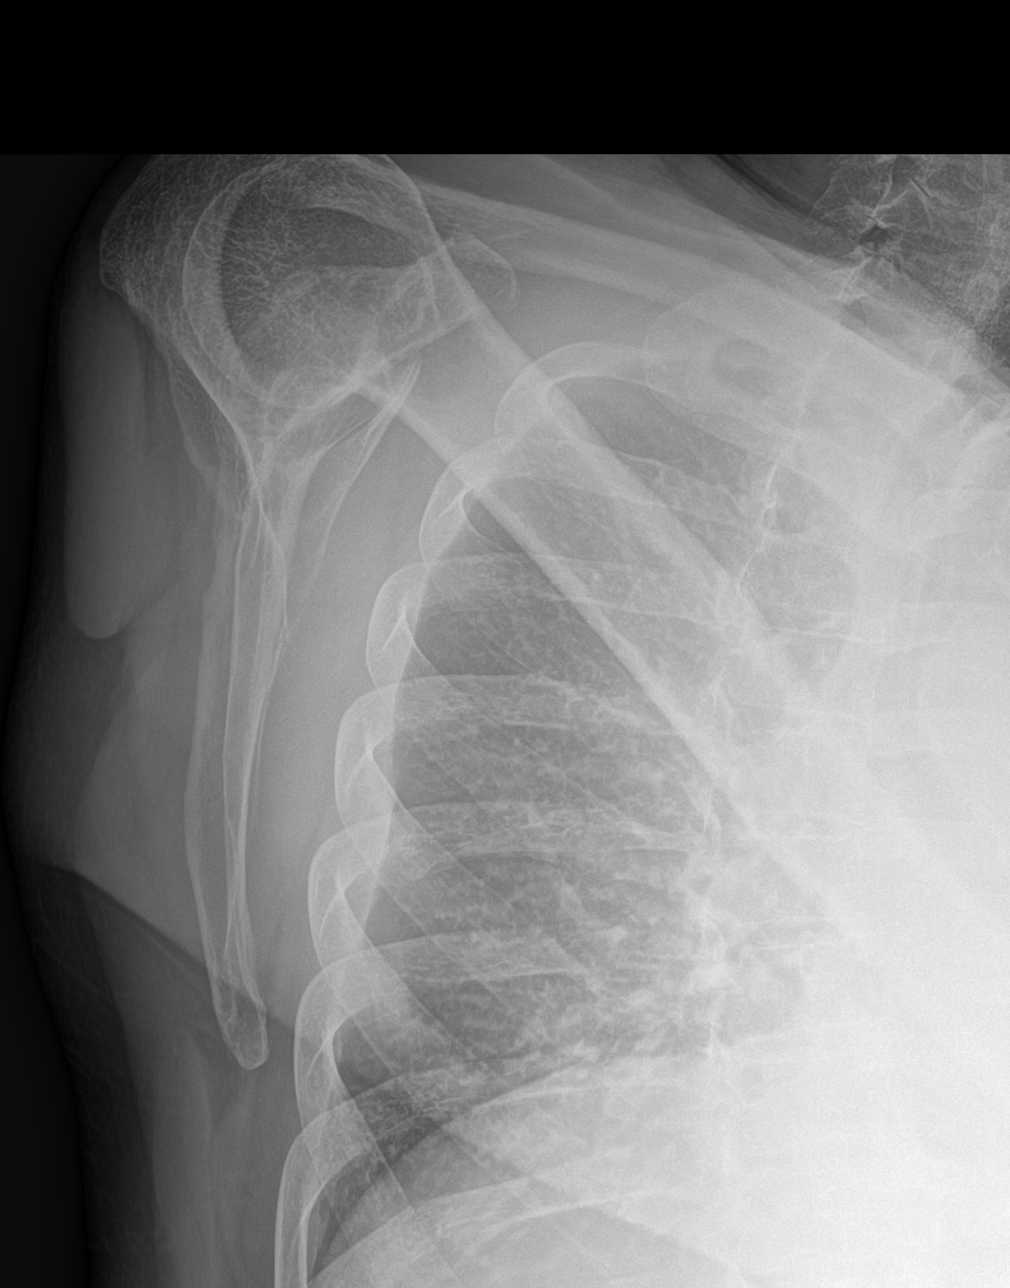

[shoulder axial]
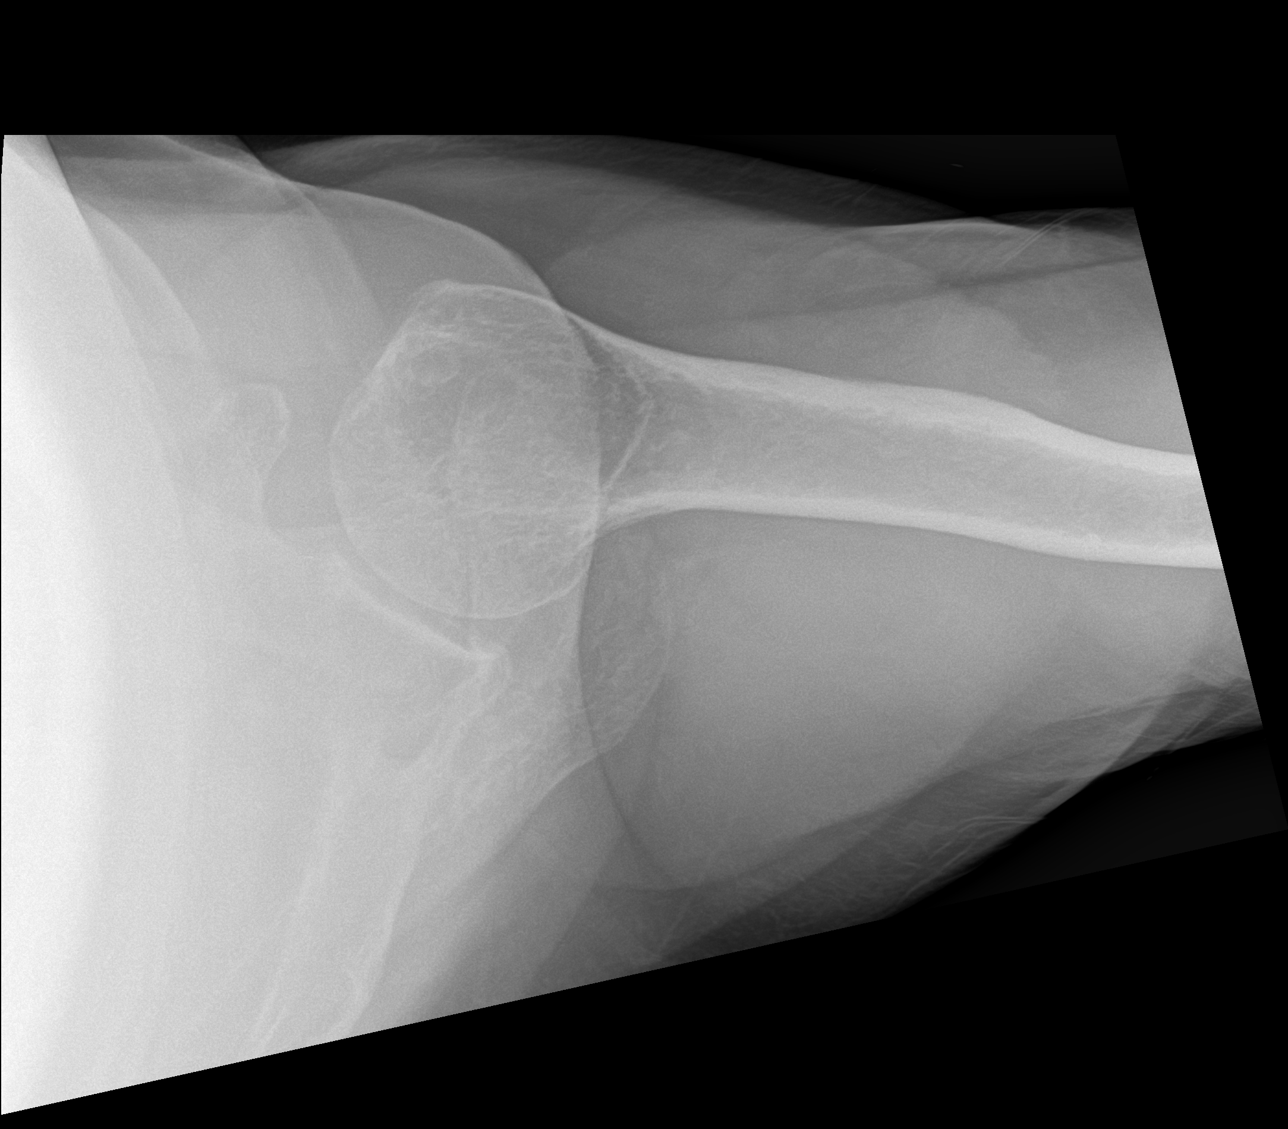

[3 of 3 positions shown; findings below may reference images not displayed]

FINDINGS: Frontal, transscapular, and axillary views of the right shoulder are
obtained. No fracture, subluxation, or dislocation. Mild
acromioclavicular joint space narrowing. Visualized portions of the
right chest are clear.
IMPRESSION: 1. Mild acromioclavicular osteoarthritis. No acute bony abnormality.

## 2022-12-04 DIAGNOSIS — E785 Hyperlipidemia, unspecified: Secondary | ICD-10-CM | POA: Diagnosis not present

## 2022-12-04 DIAGNOSIS — Z125 Encounter for screening for malignant neoplasm of prostate: Secondary | ICD-10-CM | POA: Diagnosis not present

## 2022-12-04 DIAGNOSIS — R7989 Other specified abnormal findings of blood chemistry: Secondary | ICD-10-CM | POA: Diagnosis not present

## 2022-12-11 DIAGNOSIS — Z0000 Encounter for general adult medical examination without abnormal findings: Secondary | ICD-10-CM | POA: Diagnosis not present

## 2022-12-11 DIAGNOSIS — Z23 Encounter for immunization: Secondary | ICD-10-CM | POA: Diagnosis not present

## 2022-12-11 DIAGNOSIS — Z1331 Encounter for screening for depression: Secondary | ICD-10-CM | POA: Diagnosis not present

## 2022-12-11 DIAGNOSIS — E785 Hyperlipidemia, unspecified: Secondary | ICD-10-CM | POA: Diagnosis not present

## 2022-12-11 DIAGNOSIS — Z1339 Encounter for screening examination for other mental health and behavioral disorders: Secondary | ICD-10-CM | POA: Diagnosis not present

## 2023-03-31 DIAGNOSIS — R69 Illness, unspecified: Secondary | ICD-10-CM | POA: Diagnosis not present

## 2023-12-10 DIAGNOSIS — Z0189 Encounter for other specified special examinations: Secondary | ICD-10-CM | POA: Diagnosis not present

## 2023-12-10 DIAGNOSIS — E785 Hyperlipidemia, unspecified: Secondary | ICD-10-CM | POA: Diagnosis not present

## 2023-12-10 DIAGNOSIS — Z125 Encounter for screening for malignant neoplasm of prostate: Secondary | ICD-10-CM | POA: Diagnosis not present

## 2023-12-18 DIAGNOSIS — Z1339 Encounter for screening examination for other mental health and behavioral disorders: Secondary | ICD-10-CM | POA: Diagnosis not present

## 2023-12-18 DIAGNOSIS — R233 Spontaneous ecchymoses: Secondary | ICD-10-CM | POA: Diagnosis not present

## 2023-12-18 DIAGNOSIS — Z1331 Encounter for screening for depression: Secondary | ICD-10-CM | POA: Diagnosis not present

## 2023-12-18 DIAGNOSIS — Z0000 Encounter for general adult medical examination without abnormal findings: Secondary | ICD-10-CM | POA: Diagnosis not present

## 2023-12-18 DIAGNOSIS — D751 Secondary polycythemia: Secondary | ICD-10-CM | POA: Diagnosis not present

## 2023-12-18 DIAGNOSIS — E785 Hyperlipidemia, unspecified: Secondary | ICD-10-CM | POA: Diagnosis not present

## 2024-01-28 DIAGNOSIS — N1831 Chronic kidney disease, stage 3a: Secondary | ICD-10-CM | POA: Diagnosis not present

## 2024-01-28 DIAGNOSIS — E785 Hyperlipidemia, unspecified: Secondary | ICD-10-CM | POA: Diagnosis not present

## 2024-01-29 LAB — COLOGUARD

## 2024-03-17 DIAGNOSIS — H5213 Myopia, bilateral: Secondary | ICD-10-CM | POA: Diagnosis not present

## 2024-03-17 DIAGNOSIS — H2513 Age-related nuclear cataract, bilateral: Secondary | ICD-10-CM | POA: Diagnosis not present

## 2024-03-17 DIAGNOSIS — H43813 Vitreous degeneration, bilateral: Secondary | ICD-10-CM | POA: Diagnosis not present

## 2024-04-26 NOTE — Progress Notes (Signed)
 Edwin Miller                                          MRN: [id number]   04/26/2024   The [facility] Quality Team Specialist reviewed this patient medical record for the purposes of chart review for care gap closure. The following were reviewed: chart review for care gap closure-colorectal cancer screening.    [facility] Quality Team

## 2024-07-26 ENCOUNTER — Ambulatory Visit: Admitting: Cardiology
